# Patient Record
Sex: Female | Born: 2000 | Race: White | Hispanic: No | Marital: Single | Smoking: Never smoker
Health system: Southern US, Community
[De-identification: ages and names within clinical notes are randomized; demographics above are authoritative.]

## PROBLEM LIST (undated history)

## (undated) DIAGNOSIS — J302 Other seasonal allergic rhinitis: Secondary | ICD-10-CM

## (undated) DIAGNOSIS — F419 Anxiety disorder, unspecified: Secondary | ICD-10-CM

## (undated) DIAGNOSIS — J309 Allergic rhinitis, unspecified: Secondary | ICD-10-CM

## (undated) HISTORY — PX: KNEE SURGERY: SHX244

---
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
## 2022-04-23 IMAGING — DX DG CHEST 2V
2 series · 2 of 2 positions shown · non-contrast
Comparison: None.

CLINICAL DATA: Persistent cough, worse for 3 days.

EXAM:
CHEST - 2 VIEW

[chest pa]
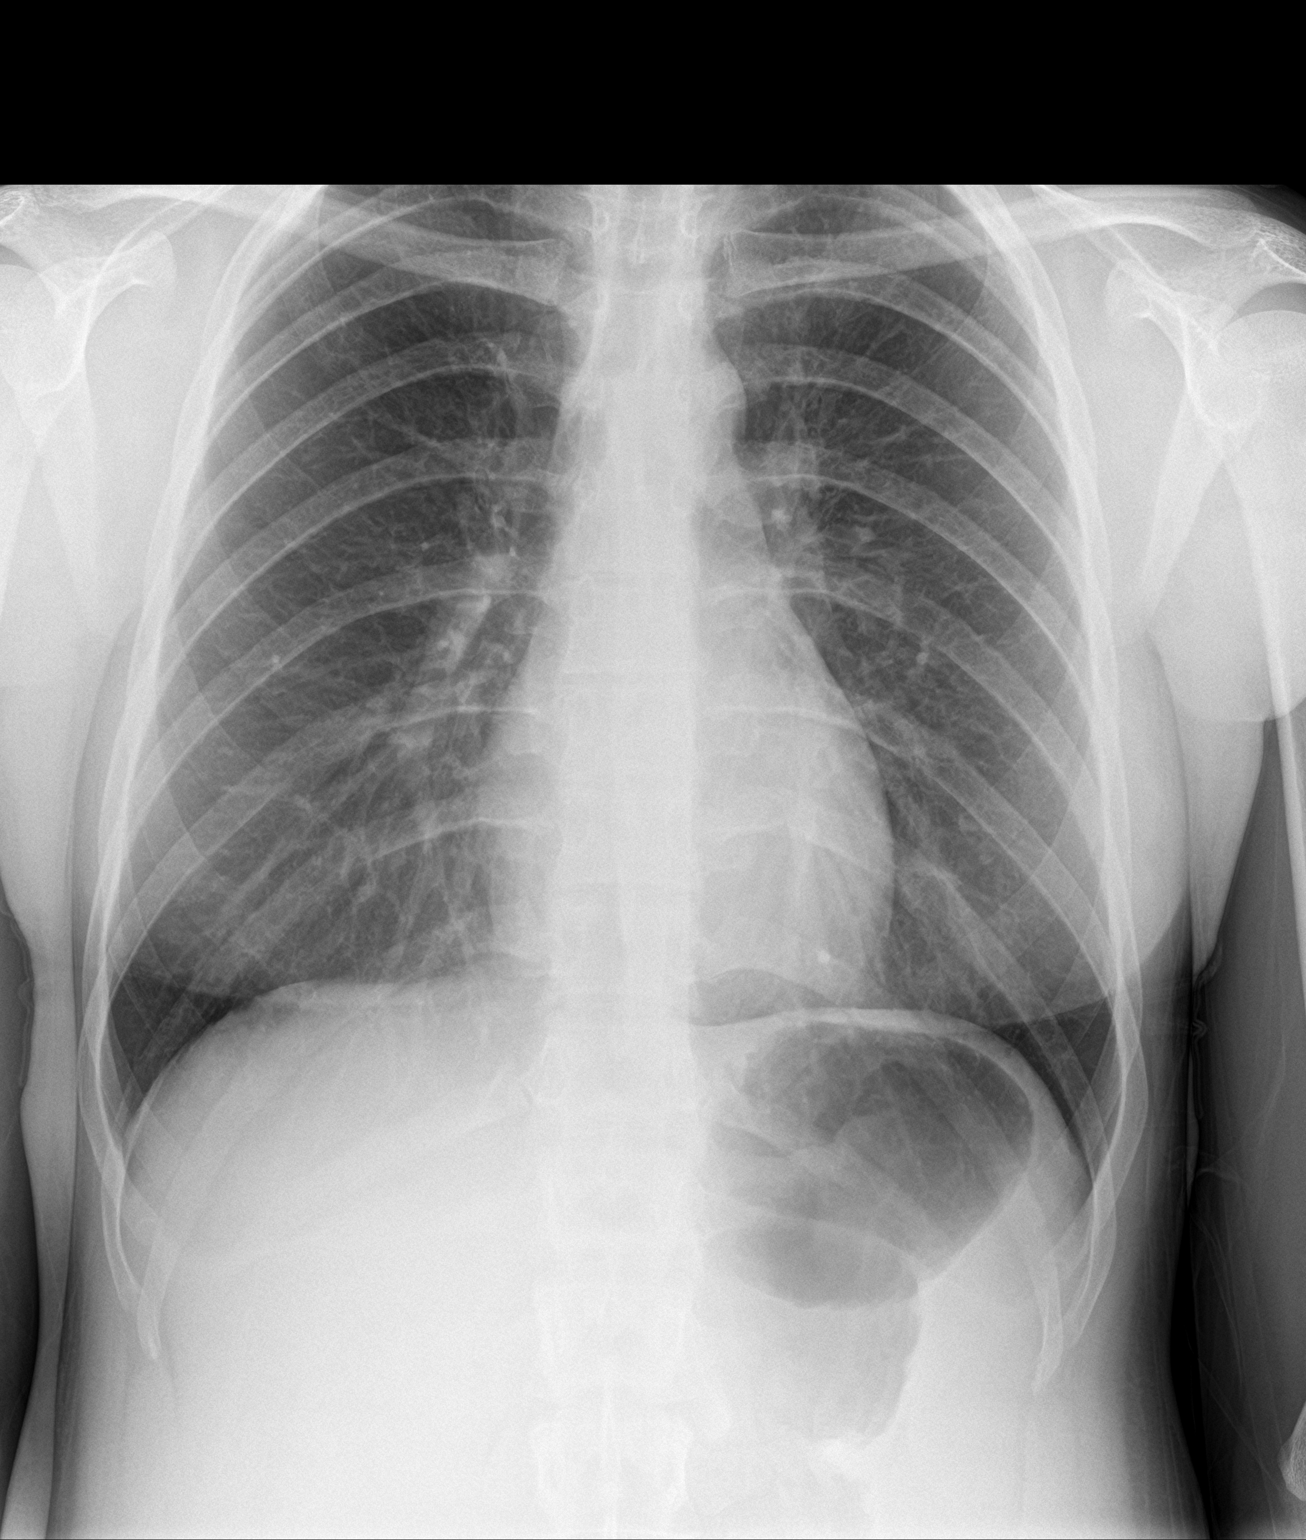

[chest lat]
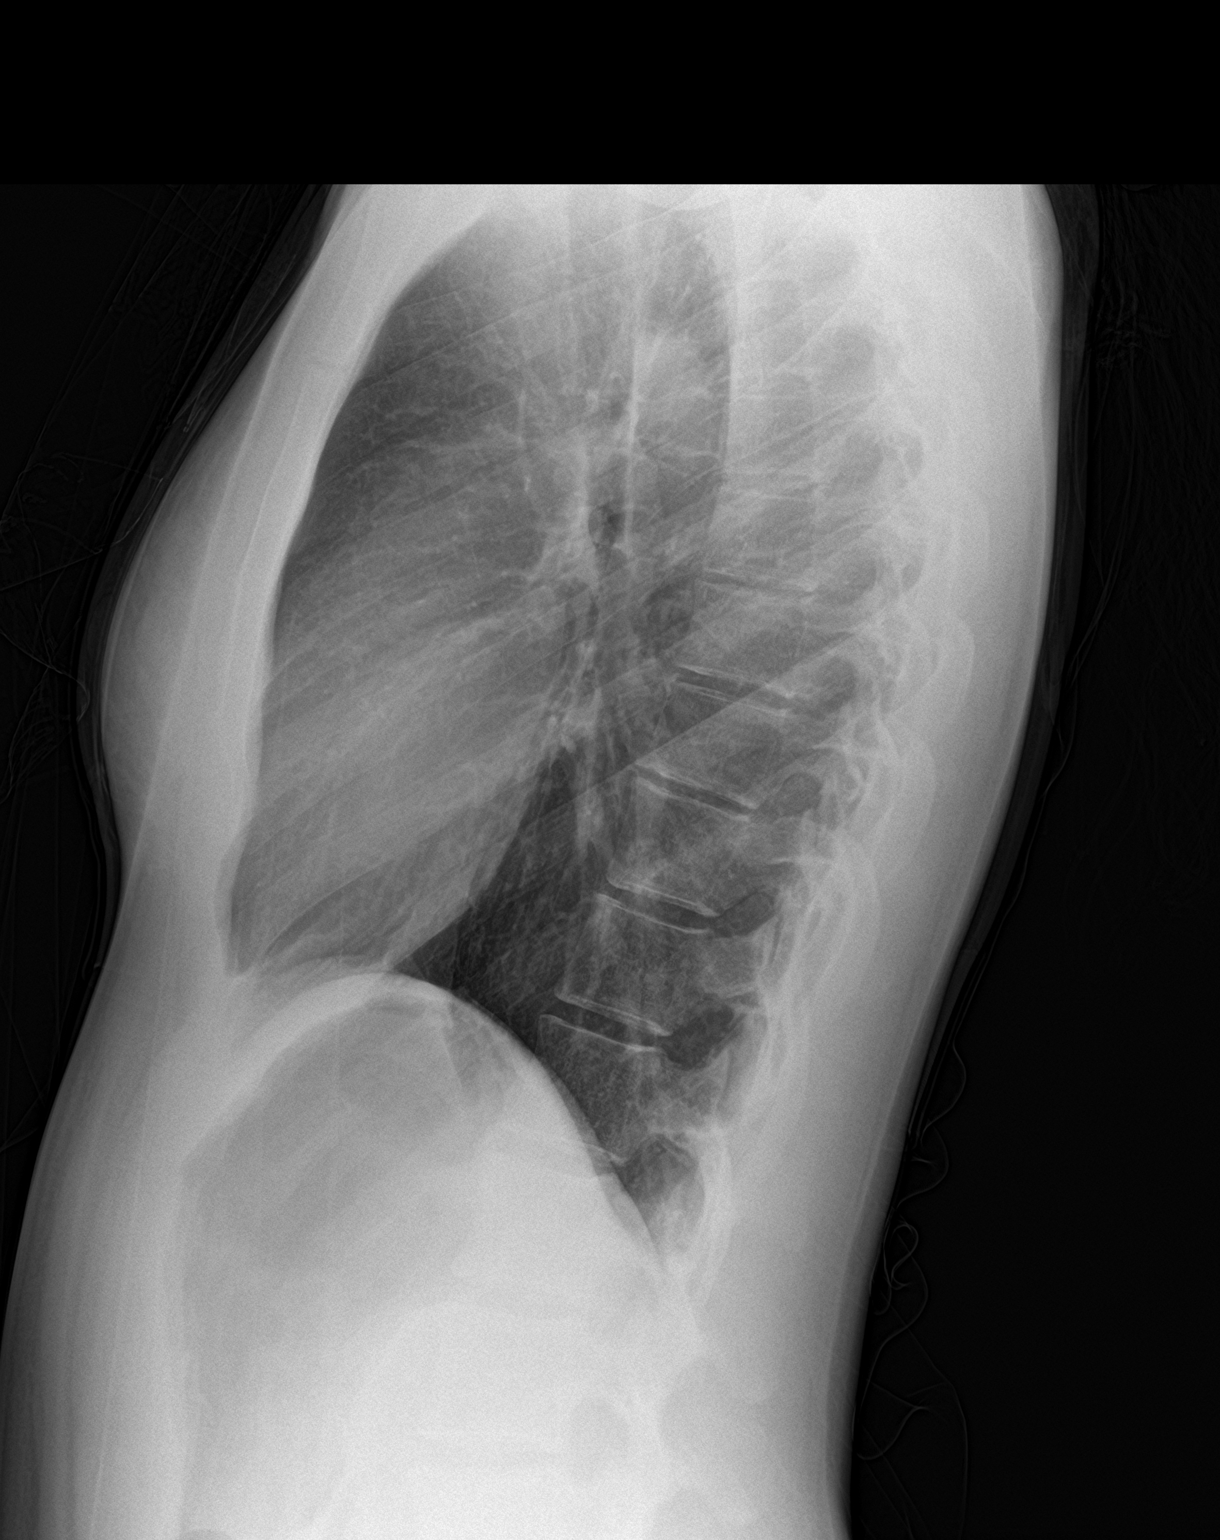

[2 of 2 positions shown; findings below may reference images not displayed]

FINDINGS: The heart size and mediastinal contours are normal. The lungs are
clear. There is no pleural effusion or pneumothorax. No acute
osseous findings are identified.
IMPRESSION: No active cardiopulmonary process.
